# Patient Record
Sex: Male | Born: 1990 | Race: Black or African American | Hispanic: No | Marital: Single | Smoking: Current every day smoker
Health system: Southern US, Community
[De-identification: ages and names within clinical notes are randomized; demographics above are authoritative.]

---
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
## 2009-05-24 IMAGING — CT CT HEAD W/O CM
1 of 2 series · 16 of 30 positions shown, 20 images · non-contrast
Comparison: None

CLINICAL DATA: Recent head trauma, headaches

CT HEAD WITHOUT CONTRAST
TECHNIQUE: Contiguous axial images were obtained from the base of
the skull through the vertex without contrast.

[Series 3: recon 2: brain · axial · 0.47mm/px · z∈[+100,+225]mm · 16 of 56 slices shown, 20 images]
[im 3/56  brain]
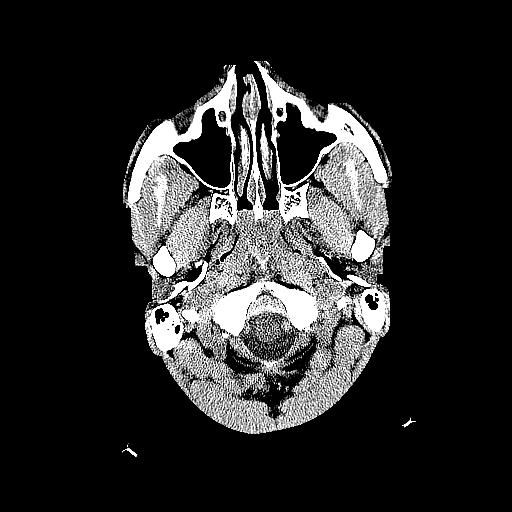
[im 3/56  bone]
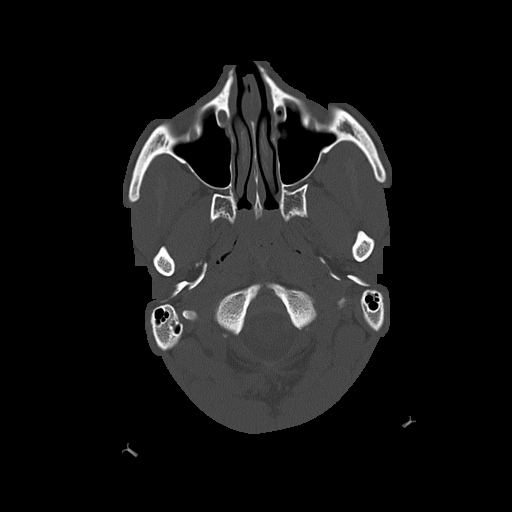
[im 6/56  brain]
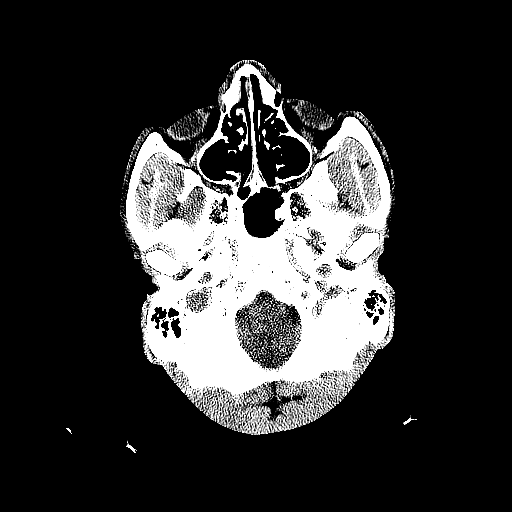
[im 9/56  brain]
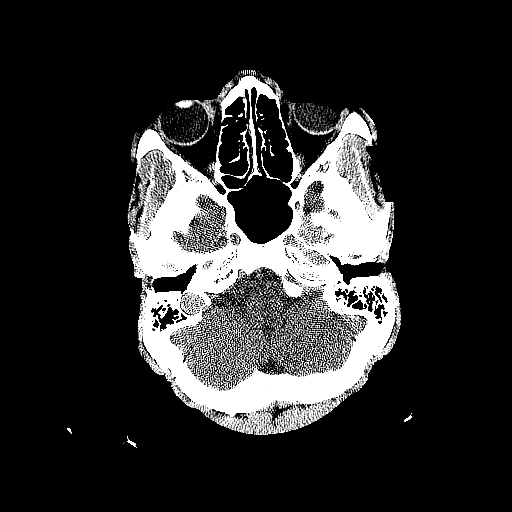
[im 12/56  brain]
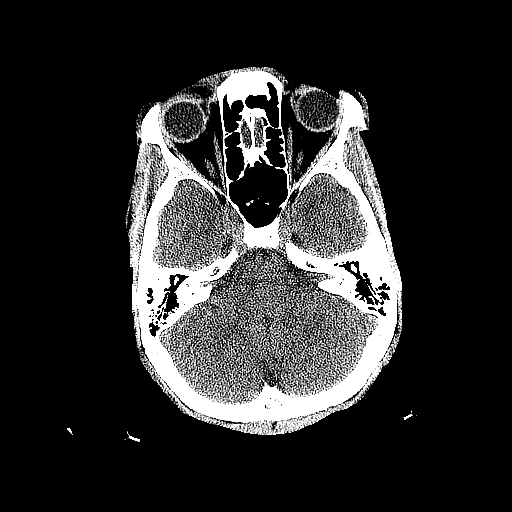
[im 18/56  brain]
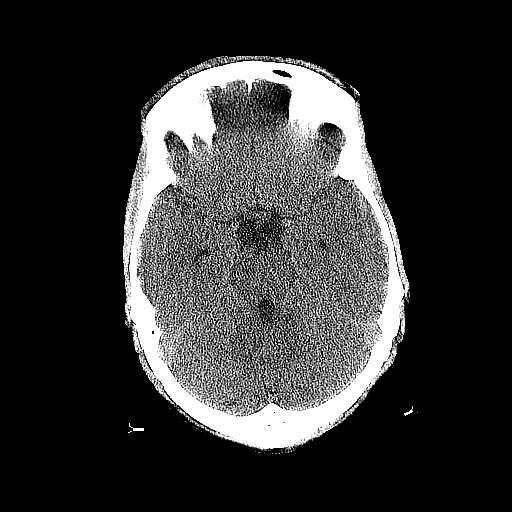
[im 18/56  bone]
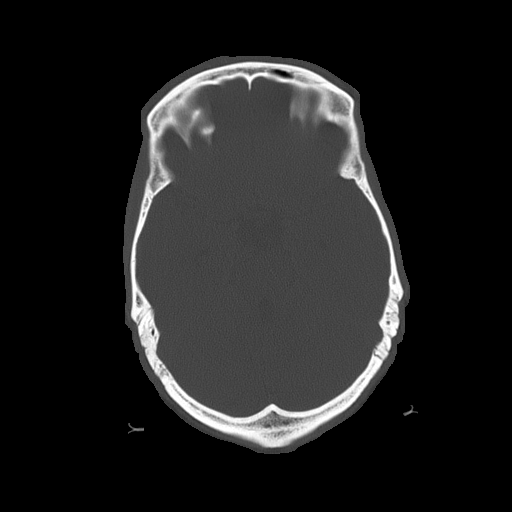
[im 21/56  brain]
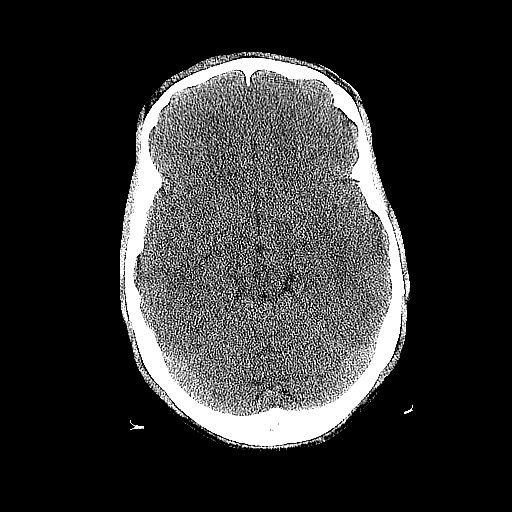
[im 24/56  brain]
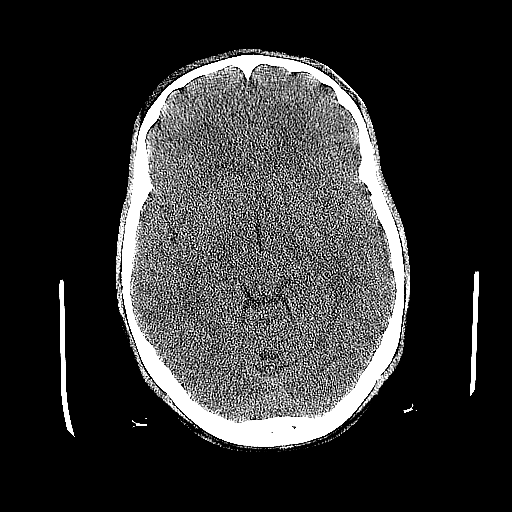
[im 27/56  brain]
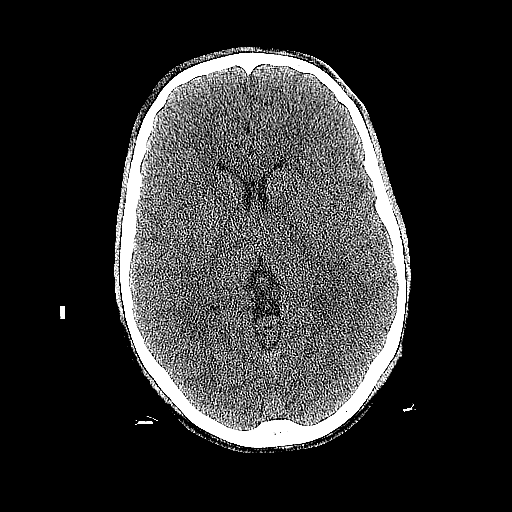
[im 29/56  brain]
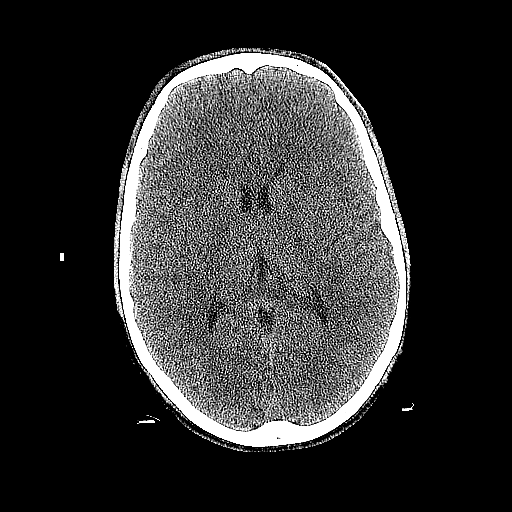
[im 29/56  bone]
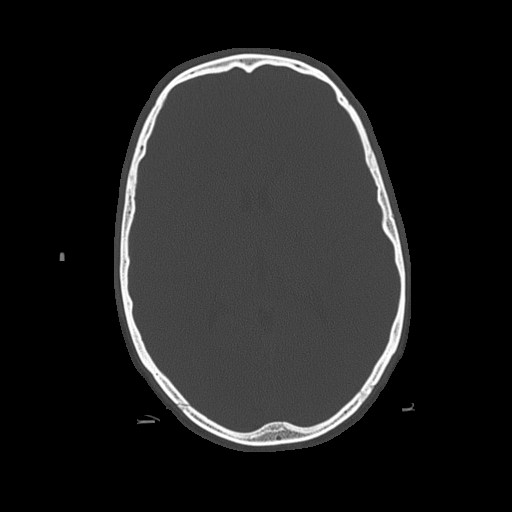
[im 32/56  brain]
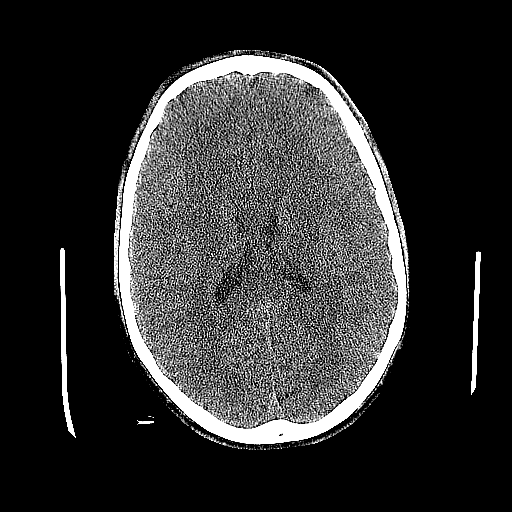
[im 35/56  brain]
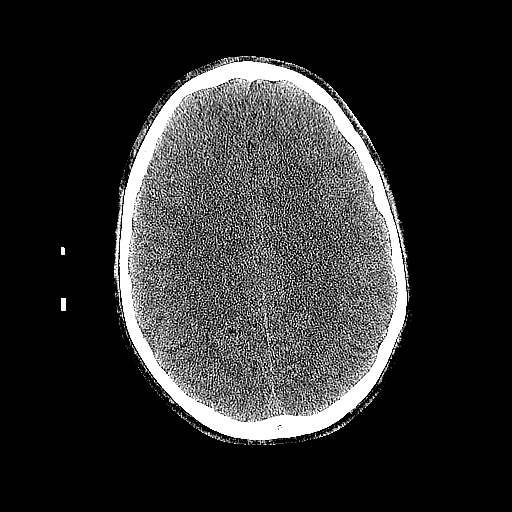
[im 38/56  brain]
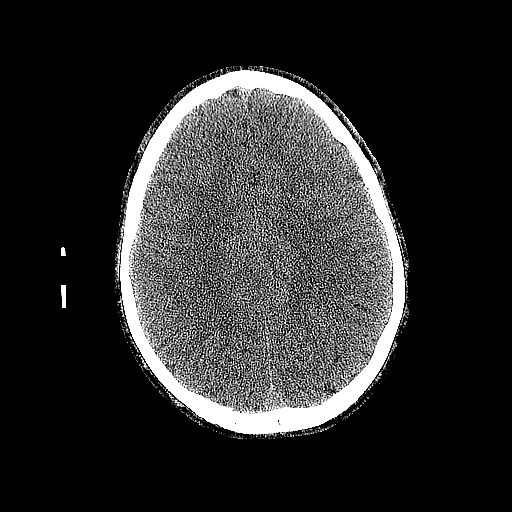
[im 44/56  brain]
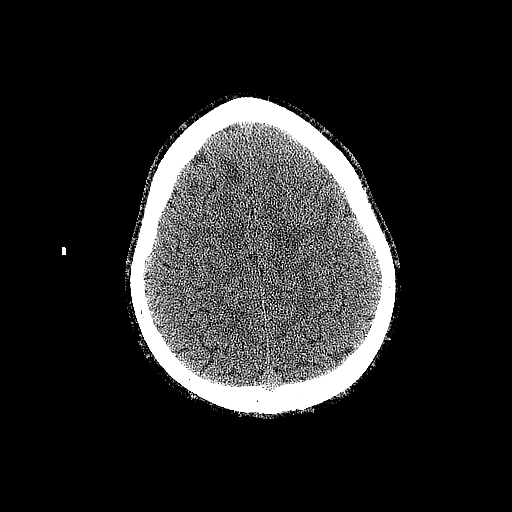
[im 44/56  bone]
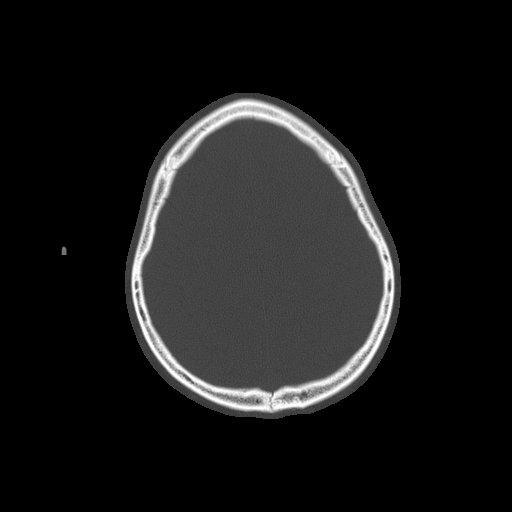
[im 47/56  brain]
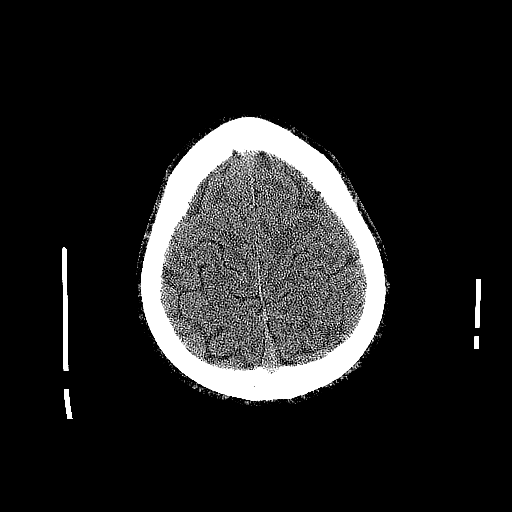
[im 50/56  brain]
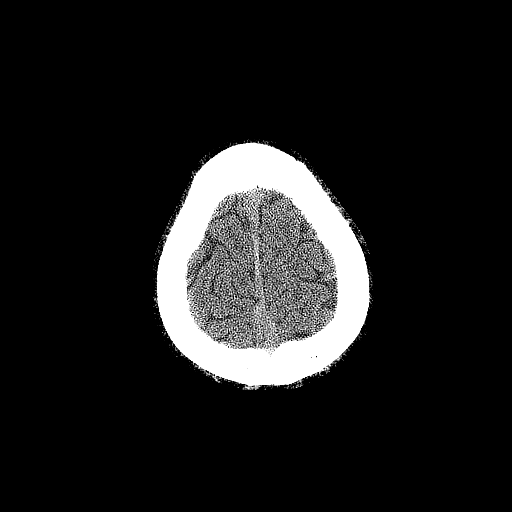
[im 53/56  brain]
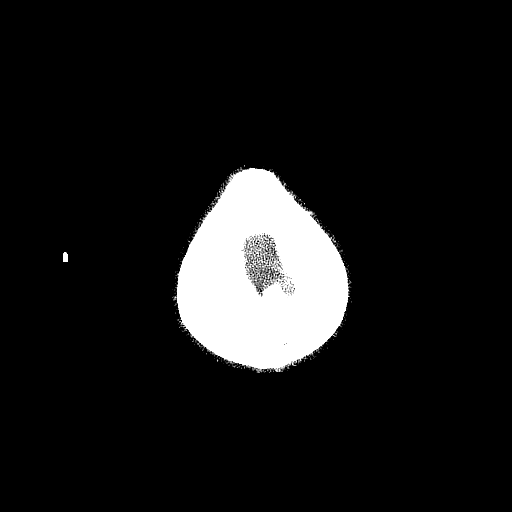

[16 of 30 positions shown; findings below may reference images not displayed]

FINDINGS: Normal ventricular morphology.
No midline shift or mass effect.
Normal appearance of brain parenchyma.
Mild beam hardening artifacts at subfrontal region bilaterally.
No intracranial hemorrhage, mass lesion, or acute infarct.
No extra-axial fluid collections.
Right frontal and supraorbital scalp hematoma.
Paranasal sinuses and visualized mastoid air cells clear.
Skull intact.
IMPRESSION: No acute intracranial abnormalities.

## 2009-06-28 ENCOUNTER — Emergency Department (HOSPITAL_COMMUNITY): Admission: EM | Admit: 2009-06-28 | Discharge: 2009-06-28 | Payer: Self-pay | Admitting: Emergency Medicine

## 2011-01-26 LAB — POCT I-STAT, CHEM 8
Calcium, Ion: 1.17 mmol/L (ref 1.12–1.32)
Creatinine, Ser: 1.2 mg/dL (ref 0.4–1.5)
Glucose, Bld: 85 mg/dL (ref 70–99)
HCT: 47 % (ref 39.0–52.0)
Hemoglobin: 16 g/dL (ref 13.0–17.0)
Hemoglobin: 16 g/dL (ref 13.0–17.0)
Sodium: 138 mEq/L (ref 135–145)
TCO2: 24 mmol/L (ref 0–100)
TCO2: 24 mmol/L (ref 0–100)

## 2011-01-26 LAB — RAPID URINE DRUG SCREEN, HOSP PERFORMED
Amphetamines: NOT DETECTED
Barbiturates: NOT DETECTED
Benzodiazepines: NOT DETECTED
Cocaine: NOT DETECTED
Opiates: NOT DETECTED

## 2011-01-26 LAB — URINALYSIS, ROUTINE W REFLEX MICROSCOPIC
Bilirubin Urine: NEGATIVE
Protein, ur: 30 mg/dL — AB
Urobilinogen, UA: 1 mg/dL (ref 0.0–1.0)

## 2011-01-26 LAB — URINE MICROSCOPIC-ADD ON
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
## 2012-06-19 ENCOUNTER — Encounter (HOSPITAL_COMMUNITY): Payer: Self-pay | Admitting: *Deleted

## 2012-06-19 ENCOUNTER — Emergency Department (HOSPITAL_COMMUNITY)
Admission: EM | Admit: 2012-06-19 | Discharge: 2012-06-19 | Disposition: A | Discharge status: Home / Self Care | Payer: Self-pay | Attending: Emergency Medicine | Admitting: Emergency Medicine

## 2012-06-19 DIAGNOSIS — S50862A Insect bite (nonvenomous) of left forearm, initial encounter: Secondary | ICD-10-CM

## 2012-06-19 DIAGNOSIS — Z91010 Allergy to peanuts: Secondary | ICD-10-CM | POA: Insufficient documentation

## 2012-06-19 DIAGNOSIS — F17200 Nicotine dependence, unspecified, uncomplicated: Secondary | ICD-10-CM | POA: Insufficient documentation

## 2012-06-19 DIAGNOSIS — IMO0001 Reserved for inherently not codable concepts without codable children: Secondary | ICD-10-CM | POA: Insufficient documentation

## 2012-06-19 MED ORDER — CEPHALEXIN 500 MG PO CAPS: 500.0000 mg | ORAL_CAPSULE | Freq: Four times a day (QID) | ORAL | Status: AC

## 2012-06-19 NOTE — ED Provider Notes (Signed)
History     CSN: [id number]  Arrival date & time 06/19/12  0905   First MD Initiated Contact with Patient 06/19/12 [phone]      Chief Complaint  Patient presents with  . Abscess    (Consider location/radiation/quality/duration/timing/severity/associated sxs/prior treatment) HPI  21 year old male presents for evaluations of possible bug bite. Patient believes he was bit by a bug 2 days ago when he slept on the floor. He noticed swelling and itch to his L forarm below the elbow.  Onset gradual, swelling has increased in size, itch has subside.  He poked it with a needle and notice clear fluid drains from it. He denies any significant pain.  No fever, or rash.  No recent trauma.     History reviewed. No pertinent past medical history.  History reviewed. No pertinent past surgical history.  History reviewed. No pertinent family history.  History  Substance Use Topics  . Smoking status: Current Everyday Smoker  . Smokeless tobacco: Not on file  . Alcohol Use: Yes     occ      Review of Systems  Constitutional: Negative for fever.  Musculoskeletal: Negative for joint swelling.  Neurological: Negative for numbness.  All other systems reviewed and are negative.    Allergies  Other and Peanuts  Home Medications   Current Outpatient Rx  Name Route Sig Dispense Refill  . ACETAMINOPHEN 500 MG PO TABS Oral Take 1,000 mg by mouth every 6 (six) hours as needed. As needed for pain.      BP 113/72  Pulse 66  Temp 98.7 F (37.1 C) (Oral)  Resp 16  SpO2 100%  Physical Exam  Nursing note and vitals reviewed. Constitutional: He appears well-developed and well-nourished. No distress.  HENT:  Head: Atraumatic.  Eyes: Conjunctivae are normal.  Neck: Neck supple.  Musculoskeletal:       L forearm: area of local skin irritation noted to dorsum of L forearm inferior to elbow.  Localized edematous skin without obvious fluctuance, mildly tender on palpation, oozing clear fluid.    Neurological: He is alert.  Skin: Skin is warm. No rash noted.  Psychiatric: He has a normal mood and affect.    ED Course  Procedures (including critical care time)  Labs Reviewed - No data to display No results found.   No diagnosis found.  1. Insect bite  MDM  Possible bug bite to L forearm with localized skin irritation, potential early forming abscess not amenable to I&D at this time.  Recommend soap/water and warm compress.  Will prescribe keflex as this may be early cellulitis.  Recommend return if swelling worsen, increasing pain, or lymphangitis.    BP 113/72  Pulse 66  Temp 98.7 F (37.1 C) (Oral)  Resp 16  SpO2 100%       Fayrene Helper, PA-C 06/19/12 1029
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
## 2012-06-20 NOTE — ED Provider Notes (Signed)
Medical screening examination/treatment/procedure(s) were performed by non-physician practitioner and as supervising physician I was immediately available for consultation/collaboration.   Loren Racer, MD 06/20/12 [phone]

## 2014-10-04 ENCOUNTER — Encounter (HOSPITAL_COMMUNITY): Payer: Self-pay | Admitting: Nurse Practitioner

## 2014-10-04 ENCOUNTER — Emergency Department (HOSPITAL_COMMUNITY)
Admission: EM | Admit: 2014-10-04 | Discharge: 2014-10-04 | Disposition: A | Discharge status: Home / Self Care | Payer: Self-pay | Attending: Emergency Medicine | Admitting: Emergency Medicine

## 2014-10-04 ENCOUNTER — Emergency Department (HOSPITAL_COMMUNITY): Payer: Self-pay

## 2014-10-04 DIAGNOSIS — Z720 Tobacco use: Secondary | ICD-10-CM | POA: Insufficient documentation

## 2014-10-04 DIAGNOSIS — Y998 Other external cause status: Secondary | ICD-10-CM | POA: Insufficient documentation

## 2014-10-04 DIAGNOSIS — S99911A Unspecified injury of right ankle, initial encounter: Secondary | ICD-10-CM | POA: Insufficient documentation

## 2014-10-04 DIAGNOSIS — S50811A Abrasion of right forearm, initial encounter: Secondary | ICD-10-CM | POA: Insufficient documentation

## 2014-10-04 DIAGNOSIS — W19XXXA Unspecified fall, initial encounter: Secondary | ICD-10-CM

## 2014-10-04 DIAGNOSIS — S40811A Abrasion of right upper arm, initial encounter: Secondary | ICD-10-CM

## 2014-10-04 DIAGNOSIS — Y92410 Unspecified street and highway as the place of occurrence of the external cause: Secondary | ICD-10-CM | POA: Insufficient documentation

## 2014-10-04 DIAGNOSIS — Y9389 Activity, other specified: Secondary | ICD-10-CM | POA: Insufficient documentation

## 2014-10-04 MED ORDER — TRAMADOL HCL 50 MG PO TABS: 50.0000 mg | ORAL_TABLET | Freq: Four times a day (QID) | ORAL | Status: DC | PRN

## 2014-10-04 NOTE — ED Notes (Signed)
ASO applied to pts right ankle pt demonstrated understanding of ASO back
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
## 2014-10-04 NOTE — Discharge Instructions (Signed)
Wear ankle brace for extra support. Take Tramadol as needed for pain. Rest, ice, and elevate your ankle. Refer to attached documents for more information.

## 2014-10-04 NOTE — ED Notes (Signed)
Placed bacitration and wrapped right arm
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
## 2017-09-19 ENCOUNTER — Encounter (HOSPITAL_COMMUNITY): Payer: Self-pay | Admitting: Emergency Medicine

## 2017-09-19 ENCOUNTER — Emergency Department (HOSPITAL_COMMUNITY)
Admission: EM | Admit: 2017-09-19 | Discharge: 2017-09-19 | Disposition: A | Discharge status: Home / Self Care | Payer: Self-pay | Attending: Emergency Medicine | Admitting: Emergency Medicine

## 2017-09-19 ENCOUNTER — Other Ambulatory Visit: Payer: Self-pay

## 2017-09-19 DIAGNOSIS — K0889 Other specified disorders of teeth and supporting structures: Secondary | ICD-10-CM | POA: Insufficient documentation

## 2017-09-19 DIAGNOSIS — F17210 Nicotine dependence, cigarettes, uncomplicated: Secondary | ICD-10-CM | POA: Insufficient documentation

## 2017-09-19 DIAGNOSIS — Z91010 Allergy to peanuts: Secondary | ICD-10-CM | POA: Insufficient documentation

## 2017-09-19 MED ORDER — NAPROXEN 500 MG PO TABS: 500.0000 mg | ORAL_TABLET | Freq: Two times a day (BID) | ORAL | 0 refills | Status: DC

## 2017-09-19 MED ORDER — CLINDAMYCIN HCL 150 MG PO CAPS: 450.0000 mg | ORAL_CAPSULE | Freq: Three times a day (TID) | ORAL | 0 refills | Status: AC

## 2017-09-19 MED ORDER — CLINDAMYCIN HCL 150 MG PO CAPS
300.0000 mg | ORAL_CAPSULE | Freq: Once | ORAL | Status: AC
  Administered 2017-09-19: 300 mg via ORAL
  Filled 2017-09-19: qty 2

## 2017-09-19 NOTE — ED Triage Notes (Signed)
Pt c/o dental/gum pain, and facial swelling gradually worsening today. Pt states that he has been using salt water rinses for an abscessed tooth for the last week. Took 2 tylenol about an hour ago PTA

## 2017-09-19 NOTE — Discharge Instructions (Signed)
Please see the information and instructions below regarding your visit.  Your diagnoses today include:  1. Pain, dental    You have a dental infection. It is very important that you get evaluated by a dentist as soon as possible. Call tomorrow to schedule an appointment. Naproxen as needed for pain. Take your full course of antibiotics. Read the instructions below.  Tests performed today include: See side panel of your discharge paperwork for testing performed today. Vital signs are listed at the bottom of these instructions.   Medications prescribed:    Take any prescribed medications only as prescribed, and any over the counter medications only as directed on the packaging.  1. You are prescribed Clindamycin, an antibiotic. Please take all of your antibiotics until finished.   You may develop abdominal discomfort or nausea from the antibiotic. If this occurs, you may take it with food. Some patients also get diarrhea with antibiotics. You may help offset this with probiotics which you can buy or get in yogurt. Do not eat or take the probiotics until 2 hours after your antibiotic.   Some people develop allergies to antibiotics. Symptoms of antibiotic allergy can be mild and include a flat rash and itching. They can also be more serious and include:  ?Hives - Hives are raised, red patches of skin that are usually very itchy.  ?Lip or tongue swelling  ?Trouble swallowing or breathing  ?Blistering of the skin or mouth.  If you have any of these serious symptoms, please seek emergency medical care immediately.  2. You are prescribed Naproxen, a non-steroidal anti-inflammatory agent (NSAID) for pain. You may take 500mg  every 12 hours as needed for pain. If still requiring this medication around the clock for acute pain after 10 days, please see your primary healthcare provider.  You may combine this medication with Tylenol, 650 mg every 6 hours, so you are receiving something for pain every  3 hours.  This is not a long-term medication unless under the care and direction of your primary provider. Taking this medication long-term and not under the supervision of a healthcare provider could increase the risk of stomach ulcers, kidney problems, and cardiovascular problems such as high blood pressure.   Home care instructions:  Please follow any educational materials contained in this packet.   Eat a soft or liquid diet and rinse your mouth out after meals with warm water. You should see a dentist or return here at once if you have increased swelling, increased pain or uncontrolled bleeding from the site of your injury.  Follow-up instructions: It is very important that you see a dentist as soon as possible. There is a list of dentists attached to this packet if you do not have care established with a dentist already. Please give a call to a dentist of your choice tomorrow.  Return instructions:  Please return to the Emergency Department if you experience worsening symptoms.  Please seek care if you note any of the following about your dental pain:  You have increased pain not controlled with medicines.  You have swelling around your tooth, in your face or neck.  You have bleeding which starts, continues, or gets worse.  You have a fever >101 If you are unable to open your mouth Please return if you have any other emergent concerns.  Additional Information:   Your vital signs today were: BP 125/82 (BP Location: Right Arm)    Pulse 78    Temp 99.3 F (37.4 C) (Oral)  Resp 16    Ht 5\' 5"  (1.651 m)    Wt 72.6 kg (160 lb)    SpO2 100%    BMI 26.63 kg/m  If your blood pressure (BP) was elevated on multiple readings during this visit above 130 for the top number or above 80 for the bottom number, please have this repeated by your primary care provider within one month. --------------  Thank you for allowing us to participate in your care today.

## 2017-09-19 NOTE — ED Provider Notes (Signed)
[facility] EMERGENCY [facility]PART[facility] Provider Note   CSN: [id number] Arrival date & time: 09/19/17  1848     History   Chief Complaint Chief Complaint  Patient presents with  . Dental Pain    HPI Chad Elliott is a 26 y.o. male.  HPI    Chad Elliott is a 26 y.o. male who presents to the Emergency Department complaining of persistent, gradually worsening, left-sided, upper dental pain beginning approximately 1 week ago but worsening in the last 24 hours. Pt describes their pain as throbbing. Pt has been performing peroxide and saltwater rinses at home with minimal relief of pain. Patient reports he thought he had an abscess surrounding the affected tooth last week but it was never evaluated by a medical professional. Pain is exacerbated by chewing. They are followed by dentistry, but patient is unsure he can afford to get the tooth removed by the dentist he has gone to in the past.  Patient reports that he feels the right side of his face is more swollen and that the area under his chin is sore. Pt denies fever, chills, difficulty breathing, difficulty swallowing.    History reviewed. No pertinent past medical history.  There are no active problems to display for this patient.   History reviewed. No pertinent surgical history.     Home Medications    Prior to Admission medications   Medication Sig Start Date End Date Taking? Authorizing Provider  acetaminophen (TYLENOL) 500 MG tablet Take 1,000 mg by mouth every 6 (six) hours as needed. As needed for pain.    [provider]  clindamycin (CLEOCIN) 150 MG capsule Take 3 capsules (450 mg total) by mouth 3 (three) times daily for 10 days. 09/19/17 09/29/17  Aviva KluverMurray, Laurent Cargile B, PA-C  naproxen (NAPROSYN) 500 MG tablet Take 1 tablet (500 mg total) by mouth 2 (two) times daily. 09/19/17   Aviva KluverMurray, Piper Hassebrock B, PA-C  traMADol (ULTRAM) 50 MG tablet Take 1 tablet (50 mg total) by mouth every  6 (six) hours as needed. 10/04/14   Emilia BeckSzekalski, Kaitlyn, PA-C    Family History No family history on file.  Social History Social History   Tobacco Use  . Smoking status: Current Every Day Smoker    Packs/day: 0.50    Types: Cigarettes  . Smokeless tobacco: Never Used  Substance Use Topics  . Alcohol use: Yes    Comment: occ  . Drug use: No     Allergies   Other and Peanuts [peanut oil]   Review of Systems Review of Systems   Physical Exam Updated Vital Signs BP 125/82 (BP Location: Right Arm)   Pulse 78   Temp 99.3 F (37.4 C) (Oral)   Resp 16   Ht 5\' 5"  (1.651 m)   Wt 72.6 kg (160 lb)   SpO2 100%   BMI 26.63 kg/m   Physical Exam  Constitutional: He appears well-developed and well-nourished. No distress.  Sitting comfortably in bed.  HENT:  Head: Normocephalic and atraumatic.  Mouth/Throat: Uvula is midline. No oral lesions. No trismus in the jaw. Abnormal dentition. No dental abscesses or uvula swelling. No oropharyngeal exudate, posterior oropharyngeal edema, posterior oropharyngeal erythema or tonsillar abscesses.    Dental cavities and poor oral dentition noted. Pain along tooth as depicted in image. No abscess noted. Midline uvula. No trismus. OP clear and moist. No oropharyngeal erythema or edema. Neck supple with discomfort to palpation in the left submandibular region. No facial edema. Facial structures are  symmetric.   Eyes: Conjunctivae are normal. Right eye exhibits no discharge. Left eye exhibits no discharge.  EOMs normal to gross examination.  Neck: Normal range of motion.  Cardiovascular: Normal rate and regular rhythm.  Intact, 2+ radial pulse.  Pulmonary/Chest:  Normal respiratory effort. Patient converses comfortably. No audible wheeze or stridor.  Abdominal: He exhibits no distension.  Musculoskeletal: Normal range of motion.  Neurological: He is alert.  Cranial nerves intact to gross observation. Patient moves extremities without  difficulty.  Skin: Skin is warm and dry. He is not diaphoretic.  Psychiatric: He has a normal mood and affect. His behavior is normal. Judgment and thought content normal.  Nursing note and vitals reviewed.    ED Treatments / Results  Labs (all labs ordered are listed, but only abnormal results are displayed) Labs Reviewed - No data to display  EKG  EKG Interpretation None       Radiology No results found.  Procedures Dental Block Date/Time: 09/20/2017 3:44 AM Performed by: Elisha PonderMurray, Dorann Davidson B, PA-C Authorized by: Elisha PonderMurray, Bob Daversa B, PA-C   Consent:    Consent obtained:  Verbal   Consent given by:  Patient   Risks discussed:  Unsuccessful block   Alternatives discussed:  No treatment Indications:    Indications: dental pain   Location:    Block type:  Supraperiosteal   Supraperiosteal location:  Upper teeth   Upper teeth location:  3/RU 1st molar Procedure details (see MAR for exact dosages):    Syringe type:  Luer lock syringe   Needle gauge:  25 G   Anesthetic injected:  Bupivacaine 0.25% WITH epi Post-procedure details:    Outcome:  Pain relieved   Patient tolerance of procedure:  Tolerated well, no immediate complications   (including critical care time)  Medications Ordered in ED Medications  clindamycin (CLEOCIN) capsule 300 mg (300 mg Oral Given 09/19/17 2244)     Initial Impression / Assessment and Plan / ED Course  I have reviewed the triage vital signs and the nursing notes.  Pertinent labs & imaging results that were available during my care of the patient were reviewed by me and considered in my medical decision making (see chart for details).     Final Clinical Impressions(s) / ED Diagnoses   Final diagnoses:  Pain, dental   Chad Elliott is a 26 y.o. male who presents to ED for dental pain. No abscess requiring immediate incision and drainage. Patient is afebrile, non toxic appearing, and swallowing secretions well. Exam not  concerning for Ludwig's angina, pharyngeal abscess, or retropharyngeal abscess. Will treat with Clindamycin. I provided dental resource guide and stressed the importance of dental follow up for ultimate management of dental pain.  Return precautions given for any difficulty swallowing, difficulty breathing, stridor, increasing facial swelling on antibiotics.  Patient voices understanding and is agreeable to plan.  This is a shared visit with Dr. Benjiman CoreNathan Pickering. Patient was independently evaluated by this attending physician and recommendation was that submandibular tenderness was not suggestive of retropharyngeal abscess per shared visit.  ED Discharge Orders        Ordered    clindamycin (CLEOCIN) 150 MG capsule  3 times daily     09/19/17 2255    naproxen (NAPROSYN) 500 MG tablet  2 times daily     09/19/17 2255        Delia ChimesMurray, Pamela Intrieri B, PA-C 09/20/17 0345    Benjiman CorePickering, Nathan, MD 09/21/17 [phone]

## 2020-05-24 ENCOUNTER — Ambulatory Visit: Payer: Self-pay

## 2020-05-31 ENCOUNTER — Ambulatory Visit: Payer: Self-pay

## 2021-09-01 ENCOUNTER — Emergency Department (HOSPITAL_COMMUNITY)
Admission: EM | Admit: 2021-09-01 | Discharge: 2021-09-02 | Disposition: A | Discharge status: Home / Self Care | Payer: Self-pay | Attending: Emergency Medicine | Admitting: Emergency Medicine

## 2021-09-01 ENCOUNTER — Emergency Department (HOSPITAL_COMMUNITY): Payer: Self-pay

## 2021-09-01 ENCOUNTER — Encounter (HOSPITAL_COMMUNITY): Payer: Self-pay | Admitting: Emergency Medicine

## 2021-09-01 ENCOUNTER — Other Ambulatory Visit: Payer: Self-pay

## 2021-09-01 DIAGNOSIS — S0990XA Unspecified injury of head, initial encounter: Secondary | ICD-10-CM | POA: Insufficient documentation

## 2021-09-01 DIAGNOSIS — Y9389 Activity, other specified: Secondary | ICD-10-CM | POA: Insufficient documentation

## 2021-09-01 DIAGNOSIS — S6991XA Unspecified injury of right wrist, hand and finger(s), initial encounter: Secondary | ICD-10-CM | POA: Insufficient documentation

## 2021-09-01 DIAGNOSIS — Z5321 Procedure and treatment not carried out due to patient leaving prior to being seen by health care provider: Secondary | ICD-10-CM | POA: Insufficient documentation

## 2021-09-01 DIAGNOSIS — S50312A Abrasion of left elbow, initial encounter: Secondary | ICD-10-CM | POA: Insufficient documentation

## 2021-09-01 DIAGNOSIS — Y92410 Unspecified street and highway as the place of occurrence of the external cause: Secondary | ICD-10-CM | POA: Insufficient documentation

## 2021-09-01 MED ORDER — TETANUS-DIPHTH-ACELL PERTUSSIS 5-2.5-18.5 LF-MCG/0.5 IM SUSY: 0.5000 mL | PREFILLED_SYRINGE | Freq: Once | INTRAMUSCULAR | Status: DC
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
## 2021-09-05 ENCOUNTER — Other Ambulatory Visit: Payer: Self-pay

## 2021-09-05 ENCOUNTER — Ambulatory Visit (HOSPITAL_COMMUNITY)
Admission: EM | Admit: 2021-09-05 | Discharge: 2021-09-05 | Disposition: A | Discharge status: Home / Self Care | Payer: Self-pay | Attending: Family Medicine | Admitting: Family Medicine

## 2021-09-05 ENCOUNTER — Encounter (HOSPITAL_COMMUNITY): Payer: Self-pay

## 2021-09-05 DIAGNOSIS — S39012A Strain of muscle, fascia and tendon of lower back, initial encounter: Secondary | ICD-10-CM

## 2021-09-05 DIAGNOSIS — S161XXA Strain of muscle, fascia and tendon at neck level, initial encounter: Secondary | ICD-10-CM

## 2021-09-05 MED ORDER — DICLOFENAC SODIUM 75 MG PO TBEC: 75.0000 mg | DELAYED_RELEASE_TABLET | Freq: Two times a day (BID) | ORAL | 0 refills | Status: DC

## 2021-09-05 MED ORDER — CYCLOBENZAPRINE HCL 10 MG PO TABS: ORAL_TABLET | ORAL | 0 refills | Status: AC
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
## 2021-09-18 ENCOUNTER — Ambulatory Visit (INDEPENDENT_AMBULATORY_CARE_PROVIDER_SITE_OTHER): Payer: Self-pay | Admitting: Family Medicine

## 2021-09-18 VITALS — BP 128/78 | Ht 64.0 in | Wt 189.0 lb

## 2021-09-18 DIAGNOSIS — S161XXD Strain of muscle, fascia and tendon at neck level, subsequent encounter: Secondary | ICD-10-CM

## 2021-09-18 DIAGNOSIS — M25552 Pain in left hip: Secondary | ICD-10-CM

## 2021-09-18 DIAGNOSIS — M25512 Pain in left shoulder: Secondary | ICD-10-CM

## 2021-09-18 DIAGNOSIS — S39012A Strain of muscle, fascia and tendon of lower back, initial encounter: Secondary | ICD-10-CM

## 2021-09-18 MED ORDER — MELOXICAM 15 MG PO TABS: 15.0000 mg | ORAL_TABLET | Freq: Every day | ORAL | 2 refills | Status: AC
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
## 2021-09-19 ENCOUNTER — Encounter: Payer: Self-pay | Admitting: Family Medicine

## 2021-09-19 NOTE — Addendum Note (Signed)
Addended by: Lenda Kelp on: 09/19/2021 09:27 AM   Modules accepted: Level of Service

## 2021-10-09 ENCOUNTER — Ambulatory Visit (INDEPENDENT_AMBULATORY_CARE_PROVIDER_SITE_OTHER): Payer: Self-pay | Admitting: Family Medicine

## 2021-10-09 ENCOUNTER — Encounter: Payer: Self-pay | Admitting: Family Medicine

## 2021-10-09 VITALS — BP 124/72 | Ht 64.0 in | Wt 185.0 lb

## 2021-10-09 DIAGNOSIS — S39012D Strain of muscle, fascia and tendon of lower back, subsequent encounter: Secondary | ICD-10-CM

## 2021-10-09 NOTE — Patient Instructions (Signed)
Keep up the good work with the exercises and stretches, especially with your low back. Try aleve 1-2 tabs twice a day with food for pain and inflammation. You can take this with tylenol. Heat 15 minutes at a time as needed for the back pain. Follow up with me in 6 weeks (or as needed if you're doing great).
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
## 2021-12-20 ENCOUNTER — Ambulatory Visit: Payer: Self-pay | Admitting: Family Medicine

## 2021-12-25 ENCOUNTER — Ambulatory Visit (INDEPENDENT_AMBULATORY_CARE_PROVIDER_SITE_OTHER): Payer: Self-pay | Admitting: Family Medicine

## 2021-12-25 VITALS — BP 122/82 | Ht 65.0 in | Wt 185.0 lb

## 2021-12-25 DIAGNOSIS — M25551 Pain in right hip: Secondary | ICD-10-CM

## 2021-12-25 DIAGNOSIS — S46811A Strain of other muscles, fascia and tendons at shoulder and upper arm level, right arm, initial encounter: Secondary | ICD-10-CM

## 2021-12-25 DIAGNOSIS — M25552 Pain in left hip: Secondary | ICD-10-CM
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
## 2021-12-25 NOTE — Patient Instructions (Signed)
Get x-rays of your hips at your convenience - we will call you with the results. ?This is likely due to snapping hip syndrome though where the IT band pops over the hip on the side. ?This improves with stretches and strengthening exercises - do those daily. ?Exercises for your right trapezius strain as well. ?Icing if needed. ?Consider formal physical therapy. ?Tylenol and/or ibuprofen if needed for pain. ?Follow up with me in 1 month for reevaluation. ?

## 2021-12-26 ENCOUNTER — Encounter: Payer: Self-pay | Admitting: Family Medicine

## 2022-01-04 ENCOUNTER — Other Ambulatory Visit: Payer: Self-pay

## 2022-01-04 ENCOUNTER — Ambulatory Visit
Admission: RE | Admit: 2022-01-04 | Discharge: 2022-01-04 | Disposition: A | Discharge status: Home / Self Care | Payer: Self-pay | Source: Ambulatory Visit | Attending: Family Medicine | Admitting: Family Medicine

## 2022-01-24 ENCOUNTER — Ambulatory Visit: Payer: Self-pay | Admitting: Family Medicine

## 2023-11-06 ENCOUNTER — Encounter (HOSPITAL_COMMUNITY): Payer: Self-pay

## 2023-11-06 ENCOUNTER — Other Ambulatory Visit: Payer: Self-pay

## 2023-11-06 ENCOUNTER — Ambulatory Visit (HOSPITAL_COMMUNITY)
Admission: RE | Admit: 2023-11-06 | Discharge: 2023-11-06 | Disposition: A | Discharge status: Home / Self Care | Payer: Self-pay | Source: Ambulatory Visit

## 2023-11-06 VITALS — BP 114/76 | HR 79 | Temp 98.9°F | Resp 16

## 2023-11-06 DIAGNOSIS — K5900 Constipation, unspecified: Secondary | ICD-10-CM

## 2023-11-06 DIAGNOSIS — K625 Hemorrhage of anus and rectum: Secondary | ICD-10-CM

## 2023-11-06 NOTE — ED Triage Notes (Signed)
 No PCP Patient has had abdominal pain.  When pain occurs, pain is lower left abdomen. Reports formed stools, reports "red blood on tissue with wiping-this occurred 3 weeks ago.  Intermittently has abdomina cramping, has vomiting, and has noticed specks of red blood in emesis.    Patient drinks alcohol every day

## 2023-11-06 NOTE — ED Provider Notes (Signed)
 [facility]    CSN: [id number] Arrival date & time: 11/06/23  1200      History   Chief Complaint Chief Complaint  Patient presents with   Abdominal Pain   Appointment    12 noon    HPI Chad Elliott is a 33 y.o. male.   Patient presents to clinic for intermittent abdominal pain that has been ongoing for the past month and a half.  He will find himself straining to have bowel movements.  Noticed a few times intermittently he will have bright red blood when he wipes after having a bowel movement.  Noticed that wiping his rectal area is very tender.  Reports intermittent vomiting with red bloody specks.  He does drink alcohol daily, has 2 40 ounce beers per night and a 16 ounce beer.  Denies any abdominal distention.  No recent vomiting.  Feels like his stool is hard balls.  Specks of blood in the toilet sometimes, but always bright red.  No current abdominal pain.  Has not tried any stool softeners.  The history is provided by the patient and medical records.  Abdominal Pain   History reviewed. No pertinent past medical history.  There are no active problems to display for this patient.   History reviewed. No pertinent surgical history.     Home Medications    Prior to Admission medications   Medication Sig Start Date End Date Taking? Authorizing Provider  acetaminophen (TYLENOL) 500 MG tablet Take 1,000 mg by mouth every 6 (six) hours as needed. As needed for pain.    [provider]  cyclobenzaprine  (FLEXERIL ) 10 MG tablet Take 1 tablet by mouth 3 times daily as needed for muscle spasm. Warning: May cause drowsiness. Patient not taking: Reported on 11/06/2023 09/05/21   Afton Albright, MD  meloxicam  (MOBIC ) 15 MG tablet Take 1 tablet (15 mg total) by mouth daily. Patient not taking: Reported on 12/25/2021 09/18/21   Salina Craver, MD    Family History History reviewed. No pertinent family history.  Social History Social History    Tobacco Use   Smoking status: Every Day    Current packs/day: 0.50    Types: Cigarettes   Smokeless tobacco: Never  Vaping Use   Vaping status: Never Used  Substance Use Topics   Alcohol use: Yes    Comment: every day   Drug use: Yes    Types: Marijuana     Allergies   Other and Peanuts [peanut oil]   Review of Systems Review of Systems  Per HPI   Physical Exam Triage Vital Signs ED Triage Vitals  Encounter Vitals Group     BP 11/06/23 1312 114/76     Systolic BP Percentile --      Diastolic BP Percentile --      Pulse Rate 11/06/23 1312 79     Resp 11/06/23 1312 16     Temp 11/06/23 1312 98.9 F (37.2 C)     Temp Source 11/06/23 1312 Oral     SpO2 11/06/23 1312 97 %     Weight --      Height --      Head Circumference --      Peak Flow --      Pain Score 11/06/23 1310 5     Pain Loc --      Pain Education --      Exclude from Growth Chart --    No data found.  Updated Vital Signs BP  114/76 (BP Location: Right Arm)   Pulse 79   Temp 98.9 F (37.2 C) (Oral)   Resp 16   SpO2 97%   Visual Acuity Right Eye Distance:   Left Eye Distance:   Bilateral Distance:    Right Eye Near:   Left Eye Near:    Bilateral Near:     Physical Exam Vitals and nursing note reviewed.  Constitutional:      Appearance: He is well-developed.  HENT:     Head: Normocephalic and atraumatic.     Mouth/Throat:     Mouth: Mucous membranes are moist.  Cardiovascular:     Rate and Rhythm: Normal rate.  Abdominal:     General: Abdomen is flat. Bowel sounds are normal.     Palpations: Abdomen is soft.     Tenderness: There is no abdominal tenderness. There is no guarding or rebound.  Skin:    General: Skin is warm and dry.  Neurological:     General: No focal deficit present.     Mental Status: He is alert and oriented to person, place, and time.  Psychiatric:        Mood and Affect: Mood normal.        Behavior: Behavior normal.      UC Treatments / Results   Labs (all labs ordered are listed, but only abnormal results are displayed) Labs Reviewed - No data to display  EKG   Radiology No results found.  Procedures Procedures (including critical care time)  Medications Ordered in UC Medications - No data to display  Initial Impression / Assessment and Plan / UC Course  I have reviewed the triage vital signs and the nursing notes.  Pertinent labs & imaging results that were available during my care of the patient were reviewed by me and considered in my medical decision making (see chart for details).  Vitals and triage reviewed, patient is hemodynamically stable.  Abdomen is soft with active bowel sounds.  No tenderness to palpation.  No rebound or guarding.  Reports a tender area when he wipes his rectum and bright red blood.  Declined rectal exam in clinic.  Will work off the suspicion of constipation and hemorrhoids.  Over-the-counter treatment discussed.   Encouraged cutting back alcohol, as this is probably contributing to the nausea and vomiting.  Concern with alcohol abuse.  Patient appears to be in denial over alcohol intake.  Plan of care, follow-up care return precautions given as well as strict emergency precautions, patient verbalized understanding.  Work note provided.  Good Rx coupons provided as well.  No questions at this time.     Final Clinical Impressions(s) / UC Diagnoses   Final diagnoses:  Constipation, unspecified constipation type  BRBPR (bright red blood per rectum)     Discharge Instructions      Use the Colace twice daily.  If this does not work you can use MiraLAX.  Instructions are below.  Seek immediate care at the nearest emergency department if you develop severe abdominal pain, vomiting up blood, or your stool is blood.  For moderate to severe constipation (not having a bowel movement in more than 3 days) then try to use an enema or Miralax once daily until you have a good bowel movement.  It  is not a good idea to use an enema or laxatives daily. If you find you are doing this, then please follow up with a gastroenterologist. Otherwise, a medication you could use daily to help with  promoting bowel movements is docusate (Colace) 100mg . It is okay to use this 1-2 times daily as a stool softener.  Try to stay active physically including regular exercise 2-3 times a week.  Make sure you hydrate well every day with about 64 ounces of water daily (that is 2 liters).  Try to avoid carb heavy foods, dairy. This includes cutting out breads, pasta, pizza, pastries, potatoes, rice, starchy foods in general. Eat more fiber as listed below:  Salads - kale, spinach, cabbage, spring mix, arugula Fruits - avocadoes, berries (blueberries, raspberries, blackberries), apples, oranges, pomegranate, grapefruit, kiwi Vegetables - asparagus, cauliflower, broccoli, green beans, brussel sprouts, bell peppers, beets; stay away from or limit starchy vegetables like potatoes, carrots, peas Other general foods - kidney beans, egg whites, almonds, walnuts, sunflower seeds, pumpkin seeds, fat free yogurt, almond milk, flax seeds, quinoa, oats  Meat - It is better to eat lean meats and limit your red meat including pork to once a week.  Wild caught fish, chicken breast are good options as they tend to be leaner sources of good protein. Still be mindful of the sodium labels for the meats you buy.  DO NOT EAT ANY FOODS ON THIS LIST THAT YOU ARE ALLERGIC TO. For more specific needs, I highly recommend consulting a dietician or nutritionist but this can definitely be a good starting point.    ED Prescriptions   None    PDMP not reviewed this encounter.   Harlow Lighter, Tylyn Stankovich  N, FNP 11/06/23 1340

## 2023-11-06 NOTE — Discharge Instructions (Addendum)
 Use the Colace twice daily.  If this does not work you can use MiraLAX.  Instructions are below. Do not strain to have a bowel movement. You can do warm baths with epsom salt to help your rectal irritation. Anusol twice daily.   Seek immediate care at the nearest emergency department if you develop severe abdominal pain, vomiting up blood, or your stool is blood.  For moderate to severe constipation (not having a bowel movement in more than 3 days) then try to use an enema or Miralax once daily until you have a good bowel movement.  It is not a good idea to use an enema or laxatives daily. If you find you are doing this, then please follow up with a gastroenterologist. Otherwise, a medication you could use daily to help with promoting bowel movements is docusate (Colace) 100mg . It is okay to use this 1-2 times daily as a stool softener.  Try to stay active physically including regular exercise 2-3 times a week.  Make sure you hydrate well every day with about 64 ounces of water daily (that is 2 liters).  Try to avoid carb heavy foods, dairy. This includes cutting out breads, pasta, pizza, pastries, potatoes, rice, starchy foods in general. Eat more fiber as listed below:  Salads - kale, spinach, cabbage, spring mix, arugula Fruits - avocadoes, berries (blueberries, raspberries, blackberries), apples, oranges, pomegranate, grapefruit, kiwi Vegetables - asparagus, cauliflower, broccoli, green beans, brussel sprouts, bell peppers, beets; stay away from or limit starchy vegetables like potatoes, carrots, peas Other general foods - kidney beans, egg whites, almonds, walnuts, sunflower seeds, pumpkin seeds, fat free yogurt, almond milk, flax seeds, quinoa, oats  Meat - It is better to eat lean meats and limit your red meat including pork to once a week.  Wild caught fish, chicken breast are good options as they tend to be leaner sources of good protein. Still be mindful of the sodium labels for the meats you  buy.  DO NOT EAT ANY FOODS ON THIS LIST THAT YOU ARE ALLERGIC TO. For more specific needs, I highly recommend consulting a dietician or nutritionist but this can definitely be a good starting point.

## 2024-02-06 ENCOUNTER — Other Ambulatory Visit: Payer: Self-pay
